# Patient Record
Sex: Male | Born: 1975 | Race: Black or African American | Hispanic: No | Marital: Single | Smoking: Current every day smoker
Health system: Southern US, Community
[De-identification: ages and names within clinical notes are randomized; demographics above are authoritative.]

---

## 1998-10-19 ENCOUNTER — Emergency Department (HOSPITAL_COMMUNITY): Admission: EM | Admit: 1998-10-19 | Discharge: 1998-10-19 | Payer: Self-pay | Admitting: Emergency Medicine

## 1998-10-19 ENCOUNTER — Encounter: Payer: Self-pay | Admitting: Emergency Medicine

## 2013-11-24 ENCOUNTER — Emergency Department (HOSPITAL_COMMUNITY): Payer: Self-pay

## 2013-11-24 ENCOUNTER — Emergency Department (HOSPITAL_COMMUNITY)
Admission: EM | Admit: 2013-11-24 | Discharge: 2013-11-25 | Disposition: A | Discharge status: Home / Self Care | Payer: Self-pay | Attending: Emergency Medicine | Admitting: Emergency Medicine

## 2013-11-24 ENCOUNTER — Encounter (HOSPITAL_COMMUNITY): Payer: Self-pay | Admitting: Emergency Medicine

## 2013-11-24 DIAGNOSIS — R1013 Epigastric pain: Secondary | ICD-10-CM | POA: Insufficient documentation

## 2013-11-24 DIAGNOSIS — F17200 Nicotine dependence, unspecified, uncomplicated: Secondary | ICD-10-CM | POA: Insufficient documentation

## 2013-11-24 LAB — COMPREHENSIVE METABOLIC PANEL
ALK PHOS: 51 U/L (ref 39–117)
ALT: 10 U/L (ref 0–53)
AST: 16 U/L (ref 0–37)
Albumin: 3.5 g/dL (ref 3.5–5.2)
BUN: 10 mg/dL (ref 6–23)
CO2: 24 mEq/L (ref 19–32)
Calcium: 9.2 mg/dL (ref 8.4–10.5)
Chloride: 103 mEq/L (ref 96–112)
Creatinine, Ser: 0.97 mg/dL (ref 0.50–1.35)
Glucose, Bld: 115 mg/dL — ABNORMAL HIGH (ref 70–99)
Potassium: 3.7 mEq/L (ref 3.7–5.3)
Sodium: 139 mEq/L (ref 137–147)
TOTAL PROTEIN: 6.3 g/dL (ref 6.0–8.3)
Total Bilirubin: 0.5 mg/dL (ref 0.3–1.2)

## 2013-11-24 LAB — CBC
HCT: 40.8 % (ref 39.0–52.0)
Hemoglobin: 13.9 g/dL (ref 13.0–17.0)
MCH: 28.4 pg (ref 26.0–34.0)
MCHC: 34.1 g/dL (ref 30.0–36.0)
MCV: 83.4 fL (ref 78.0–100.0)
Platelets: 212 10*3/uL (ref 150–400)
RBC: 4.89 MIL/uL (ref 4.22–5.81)
RDW: 12.4 % (ref 11.5–15.5)
WBC: 7.8 10*3/uL (ref 4.0–10.5)

## 2013-11-24 LAB — LIPASE, BLOOD: Lipase: 30 U/L (ref 11–59)

## 2013-11-24 LAB — I-STAT TROPONIN, ED: Troponin i, poc: 0.01 ng/mL (ref 0.00–0.08)

## 2013-11-24 MED ORDER — GI COCKTAIL ~~LOC~~
30.0000 mL | Freq: Once | ORAL | Status: AC
  Administered 2013-11-24: 30 mL via ORAL
  Filled 2013-11-24: qty 30

## 2013-11-24 MED ORDER — FAMOTIDINE 20 MG PO TABS: 20.0000 mg | ORAL_TABLET | Freq: Two times a day (BID) | ORAL | Status: AC

## 2013-11-24 NOTE — ED Notes (Signed)
PA at bedside.

## 2013-11-24 NOTE — ED Provider Notes (Signed)
CSN: [id number]     Arrival date & time 11/24/13  2137 History   First MD Initiated Contact with Patient 11/24/13 2222     Chief Complaint  Patient presents with  . Chest Pain     (Consider location/radiation/quality/duration/timing/severity/associated sxs/prior Treatment) HPI Comments: Howard Wolf is a 38 y.o. Male, presenting the Emergency Department with a chief complaint of epigastric and chest discomfort for over a week. He reports increase in discomfort with deep inspiration, increase in movement, oral intake of soda. He reports epigastric discomfort. Denies nausea, vomiting, constipation, or diarrhea.    Patient is a 38 y.o. male presenting with chest pain. The history is provided by the patient. No language interpreter was used.  Chest Pain Associated symptoms: abdominal pain   Associated symptoms: no cough, no fever, no nausea, no palpitations, no shortness of breath and not vomiting     History reviewed. No pertinent past medical history. History reviewed. No pertinent past surgical history. History reviewed. No pertinent family history. History  Substance Use Topics  . Smoking status: Current Every Day Smoker    Types: Cigarettes  . Smokeless tobacco: Not on file  . Alcohol Use: Yes     Comment: Occassional    Review of Systems  Constitutional: Negative for fever and chills.  Respiratory: Negative for cough and shortness of breath.   Cardiovascular: Positive for chest pain. Negative for palpitations and leg swelling.  Gastrointestinal: Positive for abdominal pain. Negative for nausea, vomiting, diarrhea and constipation.      Allergies  Bee venom  Home Medications   Prior to Admission medications   Not on File   BP 132/79  Pulse 63  Resp 15  Ht 5\' 9"  (1.753 m)  Wt 160 lb (72.576 kg)  BMI 23.62 kg/m2  SpO2 99% Physical Exam  Nursing note and vitals reviewed. Constitutional: He is oriented to person, place, and time. He appears well-developed and  well-nourished. No distress.  HENT:  Head: Normocephalic and atraumatic.  Eyes: EOM are normal. Pupils are equal, round, and reactive to light. No scleral icterus.  Neck: Neck supple.  Cardiovascular: Normal rate, regular rhythm and normal heart sounds.   No murmur heard. No lower extremity edema  Pulmonary/Chest: Effort normal and breath sounds normal. He has no wheezes.  Patient is able to speak in complete sentences.   Abdominal: Soft. Bowel sounds are normal. He exhibits no distension. There is tenderness in the epigastric area. There is no rebound and no guarding.  Musculoskeletal: Normal range of motion. He exhibits no edema.  Neurological: He is alert and oriented to person, place, and time.  Skin: Skin is warm and dry. No rash noted.  Psychiatric: He has a normal mood and affect. His behavior is normal.    ED Course  Procedures (including critical care time) Labs Review Labs Reviewed  COMPREHENSIVE METABOLIC PANEL - Abnormal; Notable for the following:    Glucose, Bld 115 (*)    All other components within normal limits  CBC  LIPASE, BLOOD  I-STAT TROPOININ, ED    Imaging Review Dg Chest 2 View  11/24/2013   CLINICAL DATA:  Chest pain, worse with inspiration.  EXAM: CHEST  2 VIEW  COMPARISON:  None.  FINDINGS: Heart size is normal. Mediastinal shadows are normal. The lungs are clear. No pneumothorax or hemothorax. No effusions. No bony abnormalities.  IMPRESSION: Normal chest   Electronically Signed   By: Paulina FusiMark  Shogry M.D.   On: 11/24/2013 23:13     Date:  11/24/2013  Rate: 69  Rhythm: normal sinus rhythm  QRS Axis: indeterminate  Intervals: normal  ST/T Wave abnormalities: early repolarization  Conduction Disutrbances:none  Narrative Interpretation: Borderline Right axis devation  Old EKG Reviewed: none available   MDM   Final diagnoses:  Epigastric abdominal pain   Pt with epigastric and chest discomfort for 1 week, likely reflux.  Labs EKG, XR ordered.   PERC negative. Low risk factors for ACS.  Negative work up and symptoms have have improved with GI cocktail.   Discussed lab results, imaging results, and treatment plan with the patient. Return precautions given. Reports understanding and no other concerns at this time.  Patient is stable for discharge at this time.  Meds given in ED:  Medications  gi cocktail (Maalox,Lidocaine,Donnatal) (30 mLs Oral Given 11/24/13 2238)    Discharge Medication List as of 11/24/2013 11:49 PM    START taking these medications   Details  famotidine (PEPCID) 20 MG tablet Take 1 tablet (20 mg total) by mouth 2 (two) times daily., Starting 11/24/2013, Until Discontinued, Print            Clabe SealLauren M Shalinda Burkholder, PA-C 11/26/13 0202

## 2013-11-24 NOTE — ED Notes (Signed)
Pt states that he has been having chest pain for one week. Pt states that the pain is in the epigastric region, and becomes worse with deep inspiration.

## 2013-11-24 NOTE — Discharge Instructions (Signed)
Call for a follow up appointment with a Family or Primary Care Provider.  °Return if Symptoms worsen.   °Take medication as prescribed.  ° ° °Emergency Department Resource Guide °1) Find a Doctor and Pay Out of Pocket °Although you won't have to find out who is covered by your insurance plan, it is a good idea to ask around and get recommendations. You will then need to call the office and see if the doctor you have chosen will accept you as a new patient and what types of options they offer for patients who are self-pay. Some doctors offer discounts or will set up payment plans for their patients who do not have insurance, but you will need to ask so you aren't surprised when you get to your appointment. ° °2) Contact Your Local Health Department °Not all health departments have doctors that can see patients for sick visits, but many do, so it is worth a call to see if yours does. If you don't know where your local health department is, you can check in your phone book. The CDC also has a tool to help you locate your state's health department, and many state websites also have listings of all of their local health departments. ° °3) Find a Walk-in Clinic °If your illness is not likely to be very severe or complicated, you may want to try a walk in clinic. These are popping up all over the country in pharmacies, drugstores, and shopping centers. They're usually staffed by nurse practitioners or physician assistants that have been trained to treat common illnesses and complaints. They're usually fairly quick and inexpensive. However, if you have serious medical issues or chronic medical problems, these are probably not your best option. ° °No Primary Care Doctor: °- Call [facility] Connect at  832-8000 - they can help you locate a primary care doctor that  accepts your insurance, provides certain services, etc. °- Physician Referral Service- 1-800-533-3463 ° °Chronic Pain Problems: °Organization         Address  Phone    Notes  °[facility]  (336) 297-2271 Patients need to be referred by their primary care doctor.  ° °Medication Assistance: °Organization         Address  Phone   Notes  °[place] [facility] [address] °[place], [place] [address] (336) 641-8030 --Must be a resident of [place] °-- Must have NO insurance coverage whatsoever (no Medicaid/ Medicare, etc.) °-- The pt. MUST have a primary care doctor that directs their care regularly and follows them in the community °  °[facility]  (866) 331-1348   °[employer]  (888) 892-1162   ° °Agencies that provide inexpensive medical care: °Organization         Address  Phone   Notes  °[facility]  (336) 832-8035   °[facility]    (336) 832-7272   °[facility] [address] °[place], [place] [address] (336) 832-4777   °[facility] [address], °[place] (336) 271-4999   °[facility]    (336) 373-0678   °[facility]    (336) 272-1050   °[facility] ° [address], [place] Phone:  (336) 832-4444, Fax:  (336) 832-4440 Hours of Operation:  9 am - 6 pm, M-F.  Also accepts Medicaid/Medicare and self-pay.  °[facility] ° [address], [place]   Phone: (336) 832-3150, Fax: (336) 832-3151. Hours of Operation:  8:30 am - 5:30 pm, M-F.  Also accepts Medicaid and self-pay.  °[facility] [place] [address], [place] Phone: (336) 878-6027   °[facility] [address], [place], [place] (336)723-1848, Ext. 123 Mondays & Thursdays: 7-9 AM.  First 15 patients are seen on a first come, first serve basis. °  ° °Medicaid-accepting [place] Providers: ° °Organization         Address  Phone   Notes  °[facility] [address], [place] (336) 641-2100 Also accepts self-pay patients.  °[facility]  [address], [place] ° (336) 856-9996   °[facility] [address] [place] [address], [place] (336) 288-8857   °[facility] [address] [place] [address], [place] (336) 299-7000   °Veita Bland [address], [place]  ° (336) 373-1557 Only accepts [place] Access Medicaid patients after they have their name applied to their card.  ° °Self-Pay (no insurance) in [place]: ° °Organization         Address  Phone   Notes  °Sickle Cell Patients, [facility] [address], [place] (336) 832-1970   °[facility] [address], [place] (336) 832-4400   ° [facility] ° [address], [place] (336) 992-4800   °[facility]/Dr. Osei-Bonsu ° [address] [place] [address], [place] or [address], [place] (336) 841-8500 Phone number for both [place] and [place] locations is the same.  °[facility] [address], [place] (336) 299-0000   °[facility] [address] [place] [address], [place] or [address] (336) 852-7530 °(336) 878-2260   °[facility] [address], [place] (336) 350-1642, phone; (336) 294-5005, fax Sees patients 1st and 3rd Saturday of every month.  Must not qualify for public or private insurance (i.e. Medicaid, Medicare, [place] Health Choice, Veterans' Benefits) • Household income should be no more than 200% of the poverty level •The clinic cannot treat you if you are pregnant or think you are pregnant • Sexually transmitted diseases are not treated at the clinic.  ° ° °Dental Care: °Organization         Address  Phone  Notes  °[facility] [address], [place] (336) 641-6152 Accepts children up to age 21 who are enrolled in Medicaid or [place] Health Choice; pregnant women with a Medicaid card; and children who have  applied for Medicaid or [place] Health Choice, but were declined, whose parents can pay a reduced fee at time of service.  °[facility] [place]  [address], [place] (336) 641-7733 Accepts children up to age 21 who are enrolled in Medicaid or [place] Health Choice; pregnant women with a Medicaid card; and children who have applied for Medicaid or [place] Health Choice, but were declined, whose parents can pay a reduced fee at time of service.  °[facility] ° [address],  (336) 641-4533 Patients are seen by appointment only. Walk-ins are not accepted. [facility] will see patients 18 years of age and older. °Monday - Tuesday (8am-5pm) °Most Wednesdays (8:30-5pm) °$30 per visit, cash only  °[facility] ° [address], [place] (336) 641-4533 Patients are seen by appointment only. Walk-ins are not accepted. [facility] will see patients 18 years of age and older. °One Wednesday Evening (Monthly: Volunteer Based).  $30 per visit, cash only  °[facility]  (919) 537-3737 for adults; Children under age 4, call Graduate Pediatric Dentistry at (919) 537-3956. Children aged 4-14, please call (919) 537-3737 to request a pediatric application. ° Dental services are provided in all areas of dental care including fillings, crowns and bridges, complete and partial dentures, implants, gum treatment, root canals, and extractions. Preventive care is also provided. Treatment is provided to both adults and children. °Patients are selected via a lottery and there is often a waiting list. °  °[facility] [address], °[place] ° (336) 763-8833 [email or web address] °  °[facility] [address], [place], [place] (336)723-1848, Ext. 123 Second and Fourth Thursday of each month, opens at 6:30 AM; Clinic ends at 9 AM.  Patients are seen on a first-come first-served basis, and a  limited number are seen during each clinic.  ° °[facility] ° [address], [place], [place] (336) 723-7904   Eligibility Requirements °You must have lived in [place], [place], or [place] counties for at least the last three months. °  You cannot be eligible for state or federal sponsored healthcare insurance, including Veterans Administration, Medicaid, or Medicare. °  You generally cannot be eligible for healthcare insurance through your employer.  °  How to apply: °Eligibility screenings are held every Tuesday and Wednesday afternoon from 1:00 pm until 4:00 pm. You do not need an appointment for the interview!  °[facility] [address], [place], [place] 336-631-2330   °[facility]  336-342-8273   °[facility]  336-703-3100   °[facility]  336-570-6415   ° °Behavioral Health Resources in the Community: °Intensive Outpatient Programs °Organization         Address  Phone  Notes  °[facility] [address], [place], [place] 336-878-6098   °[facility] [address], [place], [place] 336-832-9800   °[facility] [address], [place], [place] ° 336-882-2125   °[facility] [address],  °[place], [place] 1-800-853-5163 or 336-641-4981   °Substance Abuse Resources °Organization         Address  Phone  Notes  °[facility]  336-882-2125   °[facility]  336-784-9470   °[facility]  336-285-9073   °[facility]  336-845-3988   °Residential & Outpatient Substance Abuse Program  1-800-659-3381   °Psychological Services °Organization         Address  Phone  Notes  ° Health  336- 832-9600   °[facility]  336- 378-7881   °[facility] [address], [place] 1-800-853-5163 or 336-641-4981   ° °Mobile Crisis Teams °Organization          Address  Phone  Notes  °[facility], Mobile Crisis Care Unit  1-877-626-1772   °Assertive °[facility] ° [address] [place], [place] 336-834-9664   °Sharon DeEsch [address] °[place] 336-554-5454   ° °Self-Help/Support Groups °Organization         Address  Phone             Notes  °[facility] [place] - variety of   support groups  336- 373-1402 Call for more information  °Narcotics Anonymous (NA), [facility] [address], °[place]  2 meetings at this location  ° °Residential Treatment Programs °Organization         Address  Phone  Notes  °[facility] [address],    °[place]  1-866-801-8205   °[facility] ° [address], [place], [place] 704-293-8524   °[facility] [address], [place] 336-845-3988 Admissions: 8am-3pm M-F  °[facility] [address]    °[place], [place] 336-841-1104   °[facility] [address], [place], [place] 336-379-7146   °[facility] [address]  °[place], [place] 336-285-9073   °[facility] Intensive Outpatient [address], [place], [place] 336-852-3033   °[facility] [address]  °[place], [place] 1-877-615-2722 or 336-784-9470   °[facility] ([facility] [address] [place], [place] 336-227-7417 Accepts Medicaid  °[facility] [address]  °[place] 1-800-659-3381 Substance Abuse/Addiction Treatment  ° °[place] Behavioral Health Resources °Organization         Address  Phone  Notes  °[facility]  (888) 581-9988   °Julie Brannon, PhD [address] [place], [place]   (336) 349-5553 or (336) 951-0000   °[facility]   [address] °[place], [place] (336) 349-4454   °[facility] [address], [place], [place] (336) 342-8316 Insurance/Medicaid/sponsorship  through [facility]  °[facility] [address]                                    [place], [place] (336) 342-8316 Therapy/tele-psych/case  °[facility] [address]  ° [place], [place] (336) 349-2233    °Dr. Arfeen  (336) 349-4544   °[facility] [place]  [employer] [place] [facility] 1) [address], [place] °2) [address], [place] °3)  [address], [place] (336) 349-3220 °(336) 342-7768 ° °(336) 342-8140   °[place] [facility] (336) 342-1394 or (336) 342-3537 (After Hours)    ° °

## 2013-11-26 NOTE — ED Provider Notes (Signed)
Medical screening examination/treatment/procedure(s) were performed by non-physician practitioner and as supervising physician I was immediately available for consultation/collaboration.   EKG Interpretation   Date/Time:  Wednesday November 24 2013 21:42:56 EDT Ventricular Rate:  69 PR Interval:  170 QRS Duration: 88 QT Interval:  390 QTC Calculation: 418 R Axis:   92 Text Interpretation:  Age not entered, assumed to be  38 years old for  purpose of ECG interpretation Sinus rhythm Borderline right axis deviation  ST elev, probable normal early repol pattern ED PHYSICIAN INTERPRETATION  AVAILABLE IN [facility] Confirmed by TEST, Record ([id number]) on  11/26/2013 7:06:44 AM        Gwyneth SproutWhitney Shasta Chinn, MD 11/26/13 [id number]

## 2015-01-16 IMAGING — CR DG CHEST 2V
2 series · 2 of 2 positions shown · non-contrast
Comparison: None.

CLINICAL DATA: Chest pain, worse with inspiration.

EXAM:
CHEST  2 VIEW

[w chest pa]
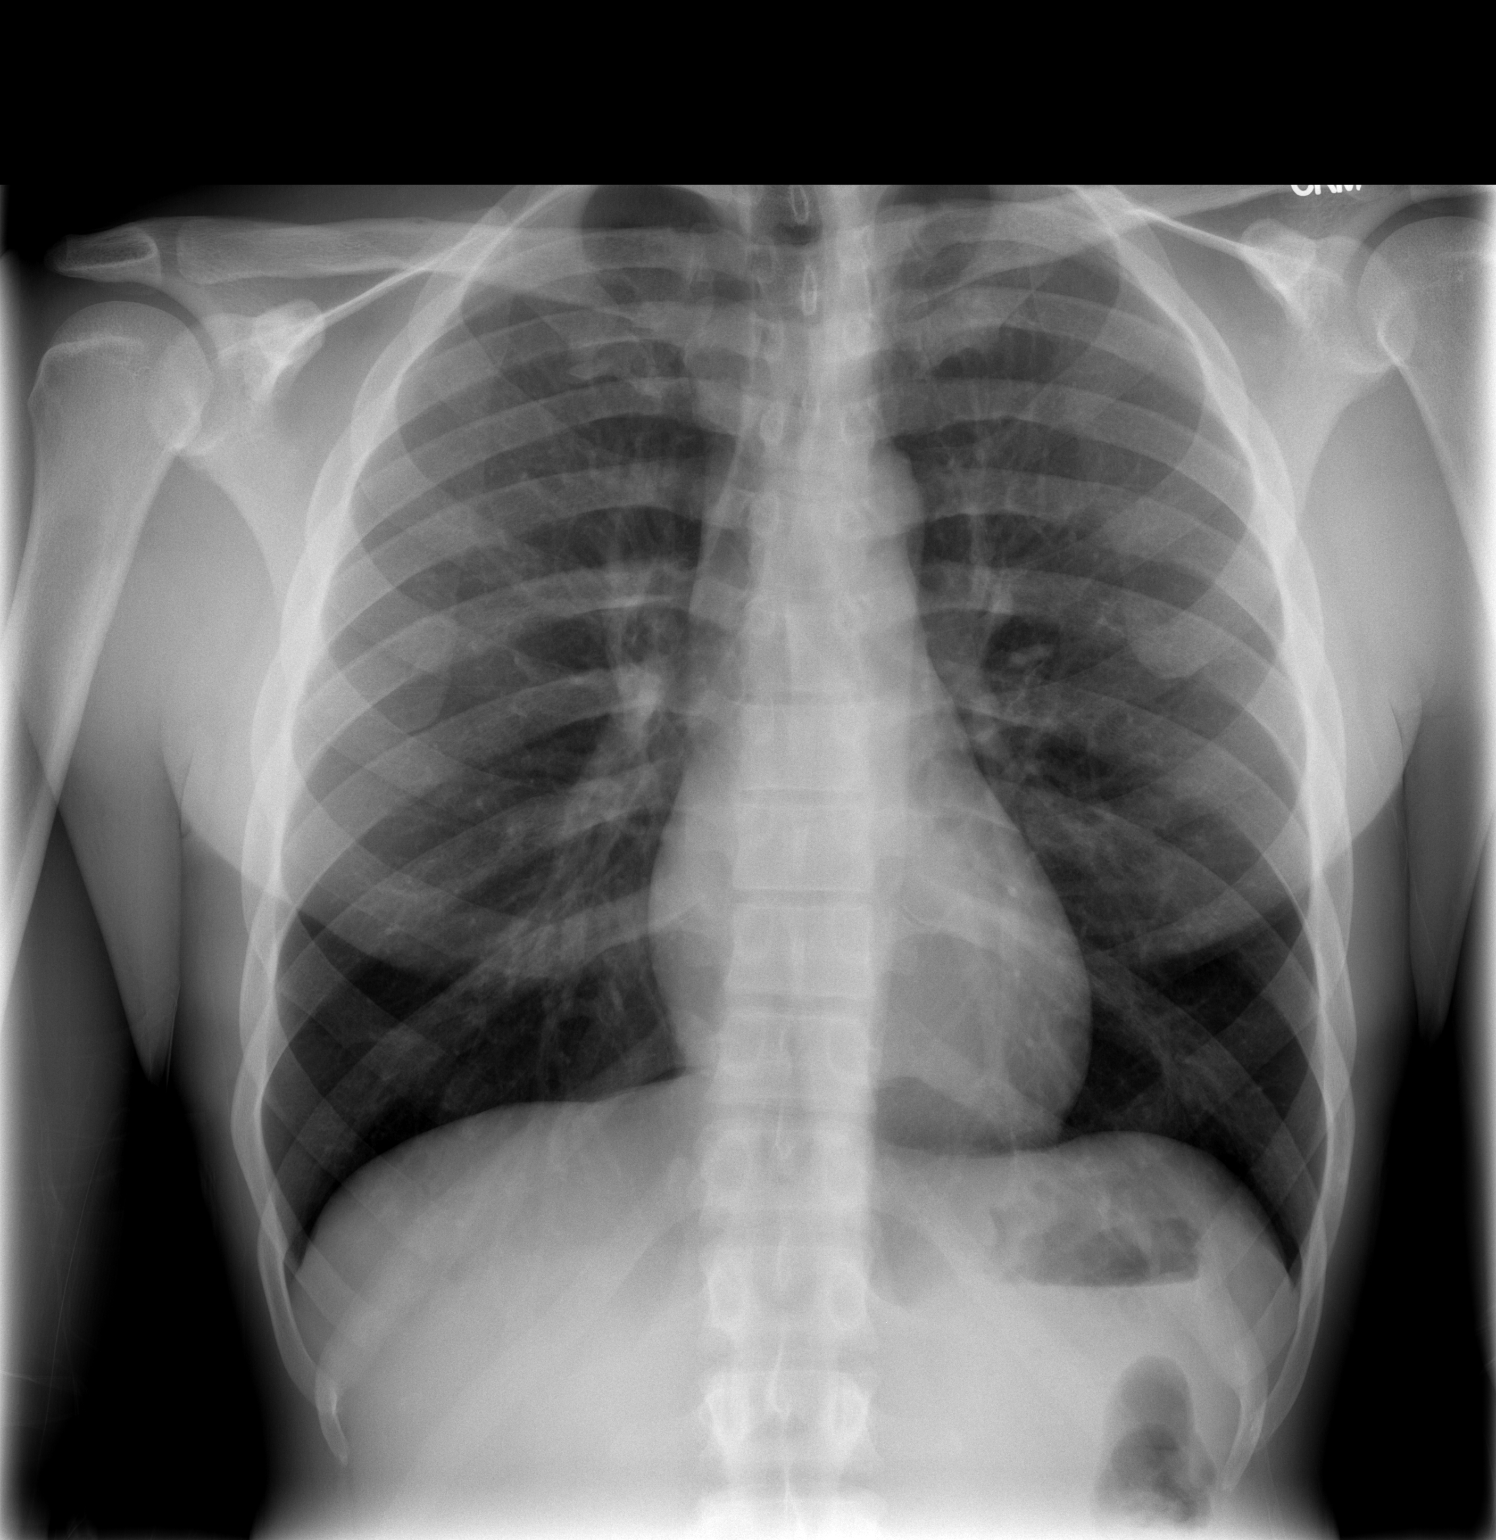

[w chest lat]
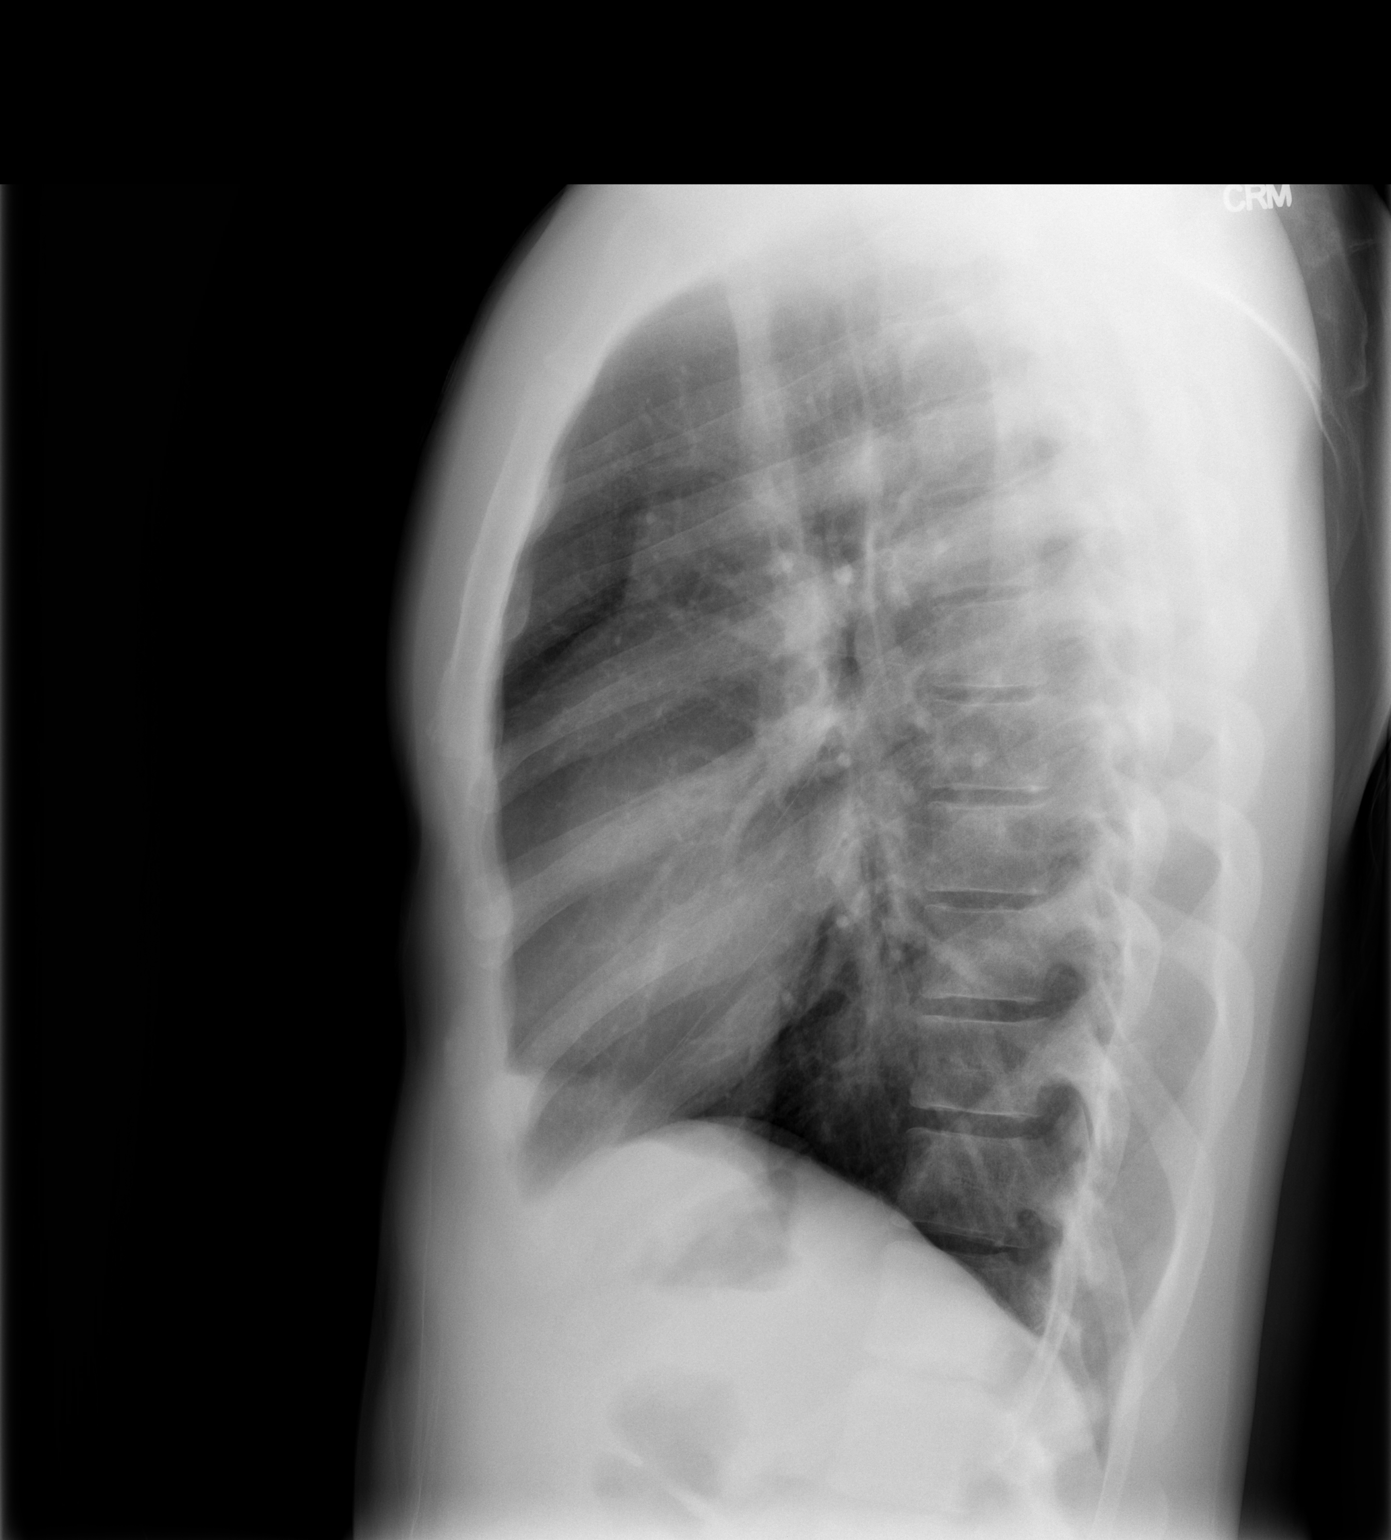

[2 of 2 positions shown; findings below may reference images not displayed]

FINDINGS: Heart size is normal. Mediastinal shadows are normal. The lungs are
clear. No pneumothorax or hemothorax. No effusions. No bony
abnormalities.
IMPRESSION: Normal chest

## 2017-06-16 ENCOUNTER — Encounter: Payer: Self-pay | Admitting: Podiatry

## 2017-06-16 ENCOUNTER — Ambulatory Visit: Payer: BLUE CROSS/BLUE SHIELD | Admitting: Podiatry

## 2017-06-16 VITALS — BP 127/80 | HR 93 | Ht 69.0 in | Wt 160.0 lb

## 2017-06-16 DIAGNOSIS — B070 Plantar wart: Secondary | ICD-10-CM

## 2017-06-16 NOTE — Progress Notes (Signed)
   Subjective:    Patient ID: Howard Wolf, male    DOB: 1976-05-10, 41 y.o.   MRN: [id number]  HPI  Chief Complaint  Patient presents with  . Plantar Warts    left foot/ wart vs callus       Review of Systems  All other systems reviewed and are negative.      Objective:   Physical Exam        Assessment & Plan:

## 2017-06-19 NOTE — Progress Notes (Signed)
   Subjective: Patient presents today with pain and tenderness on the plantar aspect of the left great toe secondary to a plantars wart. Patient states that the pain has been present for several weeks now. Patient denies trauma.  History reviewed. No pertinent past medical history.  Objective: Physical Exam General: The patient is alert and oriented x3 in no acute distress.  Dermatology: Hyperkeratotic skin lesion noted to the plantar aspect of the left foot approximately 1 cm in diameter. Pinpoint bleeding noted upon debridement. Skin is warm, dry and supple bilateral lower extremities. Negative for open lesions or macerations.  Vascular: Palpable pedal pulses bilaterally. No edema or erythema noted. Capillary refill within normal limits.  Neurological: Epicritic and protective threshold grossly intact bilaterally.   Musculoskeletal Exam: Pain on palpation to the note skin lesion.  Range of motion within normal limits to all pedal and ankle joints bilateral. Muscle strength 5/5 in all groups bilateral.   Assessment: #1 plantar wart left foot #2 pain in left foot   Plan of Care:  #1 Patient was evaluated. #2 Cantharone was applied and the lesion was dressed with a dry sterile dressing. #3 patient is to return to clinic in 2 weeks  Felecia ShellingBrent M. Genae Strine, DPM [facility]  Dr. Felecia ShellingBrent M. Veralyn Lopp, DPM    [address]                                        [place], [place] [address]                Office [phone]  Fax [phone]

## 2017-06-30 ENCOUNTER — Encounter: Payer: Self-pay | Admitting: Podiatry

## 2017-06-30 ENCOUNTER — Ambulatory Visit (INDEPENDENT_AMBULATORY_CARE_PROVIDER_SITE_OTHER): Payer: BLUE CROSS/BLUE SHIELD | Admitting: Podiatry

## 2017-06-30 DIAGNOSIS — B070 Plantar wart: Secondary | ICD-10-CM | POA: Diagnosis not present

## 2017-07-01 ENCOUNTER — Encounter: Payer: Self-pay | Admitting: Sports Medicine

## 2017-07-01 ENCOUNTER — Ambulatory Visit (INDEPENDENT_AMBULATORY_CARE_PROVIDER_SITE_OTHER): Payer: BLUE CROSS/BLUE SHIELD | Admitting: Sports Medicine

## 2017-07-01 DIAGNOSIS — B070 Plantar wart: Secondary | ICD-10-CM

## 2017-07-01 DIAGNOSIS — M79672 Pain in left foot: Secondary | ICD-10-CM

## 2017-07-01 NOTE — Progress Notes (Signed)
Subjective: Howard Wolf is a 41 y.o. male patient who presents to office for evaluation of Left foot pain. Patient complains of progressive pain after wart was treated on yesterday by Dr. Logan BoresEvans. Patient states went back to work and as the day went on started to have more pain to ball of foot and had to use crutches. Patient denies any other pedal complaints.    There are no active problems to display for this patient.   Current Outpatient Medications on File Prior to Visit  Medication Sig Dispense Refill  . famotidine (PEPCID) 20 MG tablet Take 1 tablet (20 mg total) by mouth 2 (two) times daily. 60 tablet 0   No current facility-administered medications on file prior to visit.     Allergies  Allergen Reactions  . Bee Venom Anaphylaxis    Objective:  General: Alert and oriented x3 in no acute distress  Dermatology: Macerated blister at ball of forefoot on left at area of previous wart treatment with no warmth or acute signs of infection, no webspace macerations, no ecchymosis bilateral, all nails x 10 are well manicured.  Vascular: Dorsalis Pedis and Posterior Tibial pedal pulses palpable, Capillary Fill Time 3 seconds,(+) pedal hair growth bilateral, no edema bilateral lower extremities, Temperature gradient within normal limits.  Neurology: Michaell CowingGross sensation intact via light touch bilateral..   Musculoskeletal: Moderate tenderness with palpation at ball of left foot at area of macerated blister. No other acute pedal deformities.    Assessment and Plan: Problem List Items Addressed This Visit    None    Visit Diagnoses    Plantar wart of left foot    -  Primary   Left foot pain          -Complete examination performed -Discussed treatement options for blister reaction secondary to Canthrone at left foot  -After sterile prep lanced blister at left plantar forefoot using sterile chisel blade and applied antibiotic cream and offloading padding covered with sterile  dressing -Recommend epsom soaks x 1 week and to redress as above -Recommend patient to use post op shoe and continue with crutches as needed until pain is better  -Work note sitting job until next visit  -Patient to return to office in 2 weeks for follow up with Dr. Logan BoresEvans or sooner if condition worsens.  Asencion Islamitorya Jacere Pangborn, DPM

## 2017-07-01 NOTE — Patient Instructions (Signed)
Soak with warm water and 1/4 cup of Epsom salt x 1 week Apply antibiotic cream and offloading padding Use post op shoe and crutches to help keep pressure off until it feels better Return in 2 weeks for follow up with Dr. Logan BoresEvans

## 2017-07-05 NOTE — Progress Notes (Signed)
   Subjective: Patient presents today for follow up evaluation of a plantar wart to the left foot. He reports some improvement of his symptoms. He is requesting a work noted the allows him not to be on his feet as much. Patient is here for further evaluation and treatment.    No past medical history on file.  Objective: Physical Exam General: The patient is alert and oriented x3 in no acute distress.  Dermatology: Hyperkeratotic skin lesion noted to the plantar aspect of the left foot approximately 1 cm in diameter. Pinpoint bleeding noted upon debridement. Skin is warm, dry and supple bilateral lower extremities. Negative for open lesions or macerations.  Vascular: Palpable pedal pulses bilaterally. No edema or erythema noted. Capillary refill within normal limits.  Neurological: Epicritic and protective threshold grossly intact bilaterally.   Musculoskeletal Exam: Pain on palpation to the note skin lesion.  Range of motion within normal limits to all pedal and ankle joints bilateral. Muscle strength 5/5 in all groups bilateral.   Assessment: #1 plantar wart left foot #2 pain in left foot   Plan of Care:  #1 Patient was evaluated. #2 Cantharone was applied and the lesion was dressed with a dry sterile dressing. #3 Note for work provided. #4 patient is to return to clinic in 2 weeks  Felecia ShellingBrent M. Brittanya Wolf, DPM [facility]  Dr. Felecia ShellingBrent M. Lakeishia Wolf, DPM    [address]                                        [place], [place] [address]                Office [phone]([phone]  Fax [phone](336) [phone]

## 2017-07-14 ENCOUNTER — Ambulatory Visit (INDEPENDENT_AMBULATORY_CARE_PROVIDER_SITE_OTHER): Payer: BLUE CROSS/BLUE SHIELD | Admitting: Podiatry

## 2017-07-14 DIAGNOSIS — B070 Plantar wart: Secondary | ICD-10-CM

## 2017-07-15 ENCOUNTER — Other Ambulatory Visit: Payer: Self-pay

## 2017-07-15 ENCOUNTER — Telehealth: Payer: Self-pay | Admitting: Podiatry

## 2017-07-15 MED ORDER — NONFORMULARY OR COMPOUNDED ITEM: 1.0000 g | Freq: Three times a day (TID) | 2 refills | Status: AC

## 2017-07-15 NOTE — Telephone Encounter (Signed)
[facility] Logan Bores[facility]ans supposedly sent here. They said they do not have the prescription. I'm just wondering what's going on. Please call me back at [phone].

## 2017-07-16 NOTE — Progress Notes (Signed)
   Subjective: Patient presents today for follow up evaluation of a plantar wart to the left foot.  He states the area looks better and the pain has improved.  He has no new complaints at this time.  Improved patient is here for further evaluation and treatment.    No past medical history on file.  Objective: Physical Exam General: The patient is alert and oriented x3 in no acute distress.  Dermatology: Hyperkeratotic skin lesion noted to the plantar aspect of the left foot approximately 1 cm in diameter. Pinpoint bleeding noted upon debridement. Skin is warm, dry and supple bilateral lower extremities. Negative for open lesions or macerations.  Vascular: Palpable pedal pulses bilaterally. No edema or erythema noted. Capillary refill within normal limits.  Neurological: Epicritic and protective threshold grossly intact bilaterally.   Musculoskeletal Exam: Pain on palpation to the note skin lesion.  Range of motion within normal limits to all pedal and ankle joints bilateral. Muscle strength 5/5 in all groups bilateral.   Assessment: #1 plantar wart left foot -improved   Plan of Care:  #1 Patient was evaluated. #2 Excisional debridement of hyperkeratotic lesion using a chisel blade was performed without incident.  #3 Treated area(s) with Salinocaine and dressed with light dressing. #4 Recommended good shoe gear.   #5 return to clinic as needed.    Felecia ShellingBrent M. Ann-Marie Kluge, DPM [facility]  Dr. Felecia ShellingBrent M. Rodric Punch, DPM    [address]                                        [place], [place] [address]                Office [phone]  Fax [phone](336) [phone]

## 2018-01-19 ENCOUNTER — Ambulatory Visit: Payer: BLUE CROSS/BLUE SHIELD | Admitting: Podiatry

## 2018-01-19 DIAGNOSIS — L989 Disorder of the skin and subcutaneous tissue, unspecified: Secondary | ICD-10-CM | POA: Diagnosis not present

## 2018-01-19 NOTE — Patient Instructions (Signed)
Pre-Operative Instructions  Congratulations, you have decided to take an important step towards improving your quality of life.  You can be assured that the doctors and staff at [facility] will be with you every step of the way.  Here are some important things you should know:  1. Plan to be at the surgery center/hospital at least 1 (one) hour prior to your scheduled time, unless otherwise directed by the surgical center/hospital staff.  You must have a responsible adult accompany you, remain during the surgery and drive you home.  Make sure you have directions to the surgical center/hospital to ensure you arrive on time. 2. If you are having surgery at [facility] or  hospitals, you will need a copy of your medical history and physical form from your family physician within one month prior to the date of surgery. We will give you a form for your primary physician to complete.  3. We make every effort to accommodate the date you request for surgery.  However, there are times where surgery dates or times have to be moved.  We will contact you as soon as possible if a change in schedule is required.   4. No aspirin/ibuprofen for one week before surgery.  If you are on aspirin, any non-steroidal anti-inflammatory medications (Mobic, Aleve, Ibuprofen) should not be taken seven (7) days prior to your surgery.  You make take Tylenol for pain prior to surgery.  5. Medications - If you are taking daily heart and blood pressure medications, seizure, reflux, allergy, asthma, anxiety, pain or diabetes medications, make sure you notify the surgery center/hospital before the day of surgery so they can tell you which medications you should take or avoid the day of surgery. 6. No food or drink after midnight the night before surgery unless directed otherwise by surgical center/hospital staff. 7. No alcoholic beverages 24-hours prior to surgery.  No smoking 24-hours prior or 24-hours after  surgery. 8. Wear loose pants or shorts. They should be loose enough to fit over bandages, boots, and casts. 9. Don't wear slip-on shoes. Sneakers are preferred. 10. Bring your boot with you to the surgery center/hospital.  Also bring crutches or a walker if your physician has prescribed it for you.  If you do not have this equipment, it will be provided for you after surgery. 11. If you have not been contacted by the surgery center/hospital by the day before your surgery, call to confirm the date and time of your surgery. 12. Leave-time from work may vary depending on the type of surgery you have.  Appropriate arrangements should be made prior to surgery with your employer. 13. Prescriptions will be provided immediately following surgery by your doctor.  Fill these as soon as possible after surgery and take the medication as directed. Pain medications will not be refilled on weekends and must be approved by the doctor. 14. Remove nail polish on the operative foot and avoid getting pedicures prior to surgery. 15. Wash the night before surgery.  The night before surgery wash the foot and leg well with water and the antibacterial soap provided. Be sure to pay special attention to beneath the toenails and in between the toes.  Wash for at least three (3) minutes. Rinse thoroughly with water and dry well with a towel.  Perform this wash unless told not to do so by your physician.  Enclosed: 1 Ice pack (please put in freezer the night before surgery)   1 Hibiclens skin cleaner     Pre-op instructions  If you have any questions regarding the instructions, please do not hesitate to call our office.  [place]: [address], [place], [place] [address] -- [phone]  [place]: [address] [place], [place] [address] -- [phone]  [place]: [address]  , [place] [address] -- [phone]  [place]: [address], [place], [place] [address] -- [phone]  Website:  [email or web address] 

## 2018-01-26 NOTE — Progress Notes (Signed)
   Subjective: 42 year old male presenting today for follow up evaluation of a painful lesion noted to the left plantar forefoot. He states the pain has remained the same and treatment is not helping. Walking and bearing weight increases the pain. Patient is here for further evaluation and treatment.   No past medical history on file.   Objective:  Physical Exam General: Alert and oriented x3 in no acute distress  Dermatology: Hyperkeratotic lesion present on the left plantar forefoot. Pain on palpation with a central nucleated core noted. Skin is warm, dry and supple bilateral lower extremities. Negative for open lesions or macerations.  Vascular: Palpable pedal pulses bilaterally. No edema or erythema noted. Capillary refill within normal limits.  Neurological: Epicritic and protective threshold grossly intact bilaterally.   Musculoskeletal Exam: Pain on palpation at the keratotic lesion noted. Range of motion within normal limits bilateral. Muscle strength 5/5 in all groups bilateral.  Assessment: 1. Benign skin lesion left plantar forefoot   Plan of Care:  1. Patient evaluated 2. Today we discussed the conservative versus surgical management of the presenting pathology. The patient opts for surgical management. All possible complications and details of the procedure were explained. All patient questions were answered. No guarantees were expressed or implied. 3. Authorization for surgery was initiated today. Surgery will consist of excision biopsy of benign skin lesion of the left foot. 4. Return to clinic one week post op.     Felecia ShellingBrent M. Hadyn Azer, DPM [facility]  Dr. Felecia ShellingBrent M. Oberon Hehir, DPM    [address]                                        [place], [place] [address]                Office [phone](336) [phone]  Fax [phone]([phone]

## 2018-02-03 DIAGNOSIS — M79676 Pain in unspecified toe(s): Secondary | ICD-10-CM

## 2018-02-19 ENCOUNTER — Encounter: Payer: Self-pay | Admitting: Podiatry

## 2018-02-19 DIAGNOSIS — M67472 Ganglion, left ankle and foot: Secondary | ICD-10-CM | POA: Diagnosis not present

## 2018-02-24 ENCOUNTER — Encounter: Payer: Self-pay | Admitting: Podiatry

## 2018-02-25 ENCOUNTER — Other Ambulatory Visit: Payer: BLUE CROSS/BLUE SHIELD

## 2018-02-26 ENCOUNTER — Ambulatory Visit (INDEPENDENT_AMBULATORY_CARE_PROVIDER_SITE_OTHER): Payer: BLUE CROSS/BLUE SHIELD | Admitting: Podiatry

## 2018-02-26 VITALS — Temp 97.6°F

## 2018-02-26 DIAGNOSIS — B070 Plantar wart: Secondary | ICD-10-CM

## 2018-02-26 DIAGNOSIS — L989 Disorder of the skin and subcutaneous tissue, unspecified: Secondary | ICD-10-CM

## 2018-02-26 NOTE — Progress Notes (Signed)
Subjective:   Patient ID: Howard Wolf, male   DOB: 42 y.o.   MRN: [id number]   HPI Patient presents with a well-healed surgical site plantar aspect left from removal of benign lesion   ROS      Objective:  Physical Exam  Neurovascular status intact negative Homans sign noted incision intact with wound edges well coapted     Assessment:  Chronic lesion left that was excised and is doing well     Plan:  Sterile dressing reapplied and instructed on continued immobilization reappoint 1 week suture removal or earlier if needed

## 2018-02-27 ENCOUNTER — Encounter: Payer: Self-pay | Admitting: Podiatry

## 2018-03-04 ENCOUNTER — Encounter: Payer: BLUE CROSS/BLUE SHIELD | Admitting: Podiatry

## 2018-03-08 NOTE — Progress Notes (Signed)
This encounter was created in error - please disregard.

## 2018-03-11 ENCOUNTER — Ambulatory Visit (INDEPENDENT_AMBULATORY_CARE_PROVIDER_SITE_OTHER): Payer: BLUE CROSS/BLUE SHIELD | Admitting: Podiatry

## 2018-03-11 DIAGNOSIS — L989 Disorder of the skin and subcutaneous tissue, unspecified: Secondary | ICD-10-CM

## 2018-03-11 DIAGNOSIS — Z9889 Other specified postprocedural states: Secondary | ICD-10-CM

## 2018-03-17 NOTE — Progress Notes (Signed)
   Subjective:  Patient presents today status post excision of benign lesion of the left foot. DOS: 02/19/18. He states he is doing well overall. He denies any complaints at this time. Patient is here for further evaluation and treatment.    No past medical history on file.    Objective/Physical Exam Neurovascular status intact.  Skin incisions appear to be well coapted with sutures and staples intact. No sign of infectious process noted. No dehiscence. No active bleeding noted. Moderate edema noted to the surgical extremity.  Assessment: 1. s/p excision of benign lesion of the left foot. DOS: 02/19/18   Plan of Care:  1. Patient was evaluated.  2. Sutures removed. Dry sterile dressing applied.  3. Continue using post op shoe. Weightbearing as tolerated.  4. Return to clinic in 2 weeks.    Felecia ShellingBrent M. Jusiah Aguayo, DPM [facility]  Dr. Felecia ShellingBrent M. Philbert Ocallaghan, DPM    [address]                                        [place], [place] [address]                Office [phone](336) [phone]  Fax [phone](336) [phone]

## 2018-03-25 ENCOUNTER — Encounter: Payer: BLUE CROSS/BLUE SHIELD | Admitting: Podiatry

## 2018-04-06 ENCOUNTER — Encounter: Payer: Self-pay | Admitting: Podiatry

## 2018-04-06 ENCOUNTER — Ambulatory Visit (INDEPENDENT_AMBULATORY_CARE_PROVIDER_SITE_OTHER): Payer: BLUE CROSS/BLUE SHIELD | Admitting: Podiatry

## 2018-04-06 DIAGNOSIS — Z9889 Other specified postprocedural states: Secondary | ICD-10-CM

## 2018-04-06 DIAGNOSIS — L989 Disorder of the skin and subcutaneous tissue, unspecified: Secondary | ICD-10-CM

## 2018-04-08 NOTE — Progress Notes (Signed)
   Subjective:  Patient presents today status post excision of benign lesion of the left foot. DOS: 02/19/18. He states he is doing well overall. He denies any complaints at this time. Patient is here for further evaluation and treatment.    No past medical history on file.    Objective/Physical Exam Neurovascular status intact.  Skin incisions appear to be well coapted. No sign of infectious process noted. No dehiscence. No active bleeding noted. Moderate edema noted to the surgical extremity.  Assessment: 1. s/p excision of benign lesion of the left foot. DOS: 02/19/18   Plan of Care:  1. Patient was evaluated.  2. May resume full duty with no restrictions.  3. Note for work provided.  4. Return to clinic as needed.     Felecia ShellingBrent M. Allia Wiltsey, DPM [facility]  Dr. Felecia ShellingBrent M. Thorvald Orsino, DPM    [address]                                        [place], [place] [address]                Office [phone](336) [phone]  Fax [phone](336) [phone]

## 2019-02-10 ENCOUNTER — Ambulatory Visit (INDEPENDENT_AMBULATORY_CARE_PROVIDER_SITE_OTHER): Payer: BC Managed Care – PPO

## 2019-02-10 ENCOUNTER — Encounter: Payer: Self-pay | Admitting: Podiatry

## 2019-02-10 ENCOUNTER — Other Ambulatory Visit: Payer: Self-pay

## 2019-02-10 ENCOUNTER — Ambulatory Visit: Payer: BC Managed Care – PPO | Admitting: Podiatry

## 2019-02-10 VITALS — Temp 98.1°F

## 2019-02-10 DIAGNOSIS — L989 Disorder of the skin and subcutaneous tissue, unspecified: Secondary | ICD-10-CM | POA: Diagnosis not present

## 2019-02-10 DIAGNOSIS — M7742 Metatarsalgia, left foot: Secondary | ICD-10-CM

## 2019-02-10 NOTE — Patient Instructions (Signed)
Pre-Operative Instructions  Congratulations, you have decided to take an important step towards improving your quality of life.  You can be assured that the doctors and staff at [facility] will be with you every step of the way.  Here are some important things you should know:  1. Plan to be at the surgery center/hospital at least 1 (one) hour prior to your scheduled time, unless otherwise directed by the surgical center/hospital staff.  You must have a responsible adult accompany you, remain during the surgery and drive you home.  Make sure you have directions to the surgical center/hospital to ensure you arrive on time. 2. If you are having surgery at [facility] or [facility], you will need a copy of your medical history and physical form from your family physician within one month prior to the date of surgery. We will give you a form for your primary physician to complete.  3. We make every effort to accommodate the date you request for surgery.  However, there are times where surgery dates or times have to be moved.  We will contact you as soon as possible if a change in schedule is required.   4. No aspirin/ibuprofen for one week before surgery.  If you are on aspirin, any non-steroidal anti-inflammatory medications (Mobic, Aleve, Ibuprofen) should not be taken seven (7) days prior to your surgery.  You make take Tylenol for pain prior to surgery.  5. Medications - If you are taking daily heart and blood pressure medications, seizure, reflux, allergy, asthma, anxiety, pain or diabetes medications, make sure you notify the surgery center/hospital before the day of surgery so they can tell you which medications you should take or avoid the day of surgery. 6. No food or drink after midnight the night before surgery unless directed otherwise by surgical center/hospital staff. 7. No alcoholic beverages 24-hours prior to surgery.  No smoking 24-hours prior or 24-hours after  surgery. 8. Wear loose pants or shorts. They should be loose enough to fit over bandages, boots, and casts. 9. Don't wear slip-on shoes. Sneakers are preferred. 10. Bring your boot with you to the surgery center/hospital.  Also bring crutches or a walker if your physician has prescribed it for you.  If you do not have this equipment, it will be provided for you after surgery. 11. If you have not been contacted by the surgery center/hospital by the day before your surgery, call to confirm the date and time of your surgery. 12. Leave-time from work may vary depending on the type of surgery you have.  Appropriate arrangements should be made prior to surgery with your employer. 13. Prescriptions will be provided immediately following surgery by your doctor.  Fill these as soon as possible after surgery and take the medication as directed. Pain medications will not be refilled on weekends and must be approved by the doctor. 14. Remove nail polish on the operative foot and avoid getting pedicures prior to surgery. 15. Wash the night before surgery.  The night before surgery wash the foot and leg well with water and the antibacterial soap provided. Be sure to pay special attention to beneath the toenails and in between the toes.  Wash for at least three (3) minutes. Rinse thoroughly with water and dry well with a towel.  Perform this wash unless told not to do so by your physician.  Enclosed: 1 Ice pack (please put in freezer the night before surgery)   1 Hibiclens skin cleaner     Pre-op instructions  If you have any questions regarding the instructions, please do not hesitate to call our office.  [place]: [address], [place], [place] [address] -- [phone]  [place]: [address] [place], [place] [address] -- [phone]  [place]: [address]  [place], [place] [address] -- [phone]  [place]: [address], [place], [place] [address] -- [phone]  Website:  [email or web address] 

## 2019-02-11 ENCOUNTER — Telehealth: Payer: Self-pay | Admitting: *Deleted

## 2019-02-11 NOTE — Telephone Encounter (Signed)
I am calling to inform you that Dr. Amalia Hailey cannot do your surgery on August 6.  He can do it on March 04, 2019 or April 01, 2019.  "The sooner the better, let's do it on July 23."  Thank you so much.  I'll get it scheduled.

## 2019-02-17 NOTE — Progress Notes (Signed)
   Subjective: 43 y.o. male presenting to the office today for follow up evaluation of a benign skin lesion noted to the left foot which he had excised on 02/19/2018. He states the lesion has reappeared and is slightly harder and darker now that prior to removal. There are no modifying factors noted and he has not had recent treatment. Patient is here for further evaluation and treatment.   History reviewed. No pertinent past medical history.   Objective:  Physical Exam General: Alert and oriented x3 in no acute distress  Dermatology: Hyperkeratotic lesion(s) present on the left sub-third metatarsal head. Pain on palpation with a central nucleated core noted. Skin is warm, dry and supple bilateral lower extremities. Negative for open lesions or macerations.  Vascular: Palpable pedal pulses bilaterally. No edema or erythema noted. Capillary refill within normal limits.  Neurological: Epicritic and protective threshold grossly intact bilaterally.   Musculoskeletal Exam: Pain on palpation at the keratotic lesion(s) noted. Range of motion within normal limits bilateral. Muscle strength 5/5 in all groups bilateral.  Radiographic Exam:  Normal osseous mineralization. Joint spaces preserved. No fracture/dislocation/boney destruction. Skin marker noted directly overlying third metatarsal head of the left foot.    Assessment: 1. Porokeratosis left foot sub-third MPJ 2. S/p excision of benign skin lesion of the left foot. DOS: 02/19/2018.    Plan of Care:  1. Patient evaluated. X-Rays reviewed.  2. Today we discussed the conservative versus surgical management of the presenting pathology. The patient opts for surgical management. All possible complications and details of the procedure were explained. All patient questions were answered. No guarantees were expressed or implied. 3. Authorization for surgery was initiated today. Surgery will consist of weil shortening osteotomy 3rd metatarsal left.   4. Note provided for work.  5. Return to clinic one week post op.    Edrick Kins, DPM [facility]  Dr. Edrick Kins, [place]                                        [place], [place] [address]                Office [phone]  Fax [phone]

## 2019-02-26 ENCOUNTER — Telehealth: Payer: Self-pay | Admitting: *Deleted

## 2019-02-26 NOTE — Telephone Encounter (Addendum)
DOS 03/04/2019; [address]26 - METATARSAL OSTEOTOMY 3RD LT FOOT  [employer]: Effective Date - 10/20/2016 - 37/85/8850   Not Applicable Individual  [employer]ins[employer] Authorization Required Deductible  Not Applicable  Not Applicable  No $277.41

## 2019-03-04 ENCOUNTER — Other Ambulatory Visit: Payer: Self-pay | Admitting: Podiatry

## 2019-03-04 DIAGNOSIS — M7742 Metatarsalgia, left foot: Secondary | ICD-10-CM | POA: Diagnosis not present

## 2019-03-04 MED ORDER — OXYCODONE-ACETAMINOPHEN 5-325 MG PO TABS: 1.0000 | ORAL_TABLET | Freq: Four times a day (QID) | ORAL | 0 refills | Status: DC | PRN

## 2019-03-04 NOTE — Progress Notes (Signed)
.  postop

## 2019-03-05 ENCOUNTER — Other Ambulatory Visit: Payer: Self-pay | Admitting: Podiatry

## 2019-03-05 ENCOUNTER — Telehealth: Payer: Self-pay | Admitting: Podiatry

## 2019-03-05 MED ORDER — OXYCODONE-ACETAMINOPHEN 5-325 MG PO TABS: 1.0000 | ORAL_TABLET | Freq: Four times a day (QID) | ORAL | 0 refills | Status: DC | PRN

## 2019-03-05 MED ORDER — OXYCODONE-ACETAMINOPHEN 5-325 MG PO TABS: 1.0000 | ORAL_TABLET | Freq: Four times a day (QID) | ORAL | 0 refills | Status: AC | PRN

## 2019-03-05 NOTE — Telephone Encounter (Signed)
Per Dr. Rebekah Chesterfield verbal, he has sent medication into [facility] [address]

## 2019-03-05 NOTE — Progress Notes (Signed)
.  postop

## 2019-03-05 NOTE — Telephone Encounter (Signed)
Pt had surgery yesterday 03/04/19 and had Oxycodone sent to the [facility] at [facility] and he states he is unable to get the prescription from that pharmacy. Pt would like to have the prescription sent to [facility] on [address] instead.

## 2019-03-05 NOTE — Telephone Encounter (Signed)
Rx sent to [facility] on [address] - Dr. Amalia Hailey

## 2019-03-08 ENCOUNTER — Telehealth: Payer: Self-pay

## 2019-03-08 ENCOUNTER — Encounter: Payer: Self-pay | Admitting: Podiatry

## 2019-03-08 NOTE — Telephone Encounter (Signed)
Called pt post surgery; pt stated, "doing alright; no excessive pain or bleeding; nothing unusual; have appt on Wednesday"; informed patient the importance of elevating surgical foot and staying off of it as much as possible; instructed patient to call if any questions or concerns before next post op visit.

## 2019-03-10 ENCOUNTER — Other Ambulatory Visit: Payer: Self-pay

## 2019-03-10 ENCOUNTER — Ambulatory Visit (INDEPENDENT_AMBULATORY_CARE_PROVIDER_SITE_OTHER): Payer: BC Managed Care – PPO

## 2019-03-10 ENCOUNTER — Encounter: Payer: Self-pay | Admitting: Podiatry

## 2019-03-10 ENCOUNTER — Ambulatory Visit (INDEPENDENT_AMBULATORY_CARE_PROVIDER_SITE_OTHER): Payer: BC Managed Care – PPO | Admitting: Podiatry

## 2019-03-10 VITALS — Temp 97.8°F

## 2019-03-10 DIAGNOSIS — Z9889 Other specified postprocedural states: Secondary | ICD-10-CM

## 2019-03-10 DIAGNOSIS — M7742 Metatarsalgia, left foot: Secondary | ICD-10-CM | POA: Diagnosis not present

## 2019-03-11 NOTE — Progress Notes (Signed)
Subjective:   Patient ID: Howard Wolf, male   DOB: 43 y.o.   MRN: [id number]   HPI Patient states seems to be doing pretty good with mild discomfort and taking pain medicines as needed after surgery by Dr. Amalia Hailey 1 week ago   ROS      Objective:  Physical Exam  Neurovascular status intact negative Homans sign noted with patient's left foot healing well wound edges well coapted and incision site not draining or showing any pathology with mild edema noted     Assessment:  Doing well post osteotomy third metatarsal left     Plan:  H&P x-ray reviewed and reapplied sterile dressing with continued immobilization elevation compression and reappoint 1 week or earlier if needed  X-ray indicates screws in place third metatarsal left with no signs of pathology

## 2019-03-17 ENCOUNTER — Other Ambulatory Visit: Payer: Self-pay

## 2019-03-17 ENCOUNTER — Ambulatory Visit (INDEPENDENT_AMBULATORY_CARE_PROVIDER_SITE_OTHER): Payer: Self-pay | Admitting: Podiatry

## 2019-03-17 DIAGNOSIS — M7742 Metatarsalgia, left foot: Secondary | ICD-10-CM

## 2019-03-17 DIAGNOSIS — Z9889 Other specified postprocedural states: Secondary | ICD-10-CM

## 2019-03-20 NOTE — Progress Notes (Signed)
   Subjective:  Patient presents today status post Weil osteotomy 3rd metatarsal left. DOS: 03/04/2019. He states he is doing well. He denies any significant pain or modifying factors. He has been using the CAM boot as directed. Patient is here for further evaluation and treatment.   No past medical history on file.    Objective/Physical Exam Neurovascular status intact.  Skin incisions appear to be well coapted with sutures and staples intact. No sign of infectious process noted. No dehiscence. No active bleeding noted. Moderate edema noted to the surgical extremity.  Assessment: 1. s/p Weil osteotomy 3rd metatarsal left. DOS: 03/04/2019   Plan of Care:  1. Patient was evaluated.  2. Sutures removed.  3. Continue weightbearing in CAM boot for two weeks.  4. Return to clinic in 2 weeks for follow up X-Rays.    Edrick Kins, DPM [facility]  Dr. Edrick Kins, [address]                                        [place], [place] [address]                Office [phone]  Fax [phone]

## 2019-04-05 ENCOUNTER — Ambulatory Visit (INDEPENDENT_AMBULATORY_CARE_PROVIDER_SITE_OTHER): Payer: BC Managed Care – PPO | Admitting: Podiatry

## 2019-04-05 ENCOUNTER — Encounter: Payer: Self-pay | Admitting: Podiatry

## 2019-04-05 ENCOUNTER — Ambulatory Visit (INDEPENDENT_AMBULATORY_CARE_PROVIDER_SITE_OTHER): Payer: BC Managed Care – PPO

## 2019-04-05 ENCOUNTER — Other Ambulatory Visit: Payer: Self-pay

## 2019-04-05 VITALS — Temp 98.5°F

## 2019-04-05 DIAGNOSIS — M7742 Metatarsalgia, left foot: Secondary | ICD-10-CM

## 2019-04-05 DIAGNOSIS — Z9889 Other specified postprocedural states: Secondary | ICD-10-CM

## 2019-04-07 NOTE — Progress Notes (Signed)
   Subjective:  Patient presents today status post Weil osteotomy 3rd metatarsal left. DOS: 03/04/2019. He states he is doing very well. He denies any pain or modifying factors. He has been using the CAM boot as directed. Patient is here for further evaluation and treatment.   History reviewed. No pertinent past medical history.    Objective/Physical Exam Neurovascular status intact.  Skin incisions appear to be well coapted. No sign of infectious process noted. No dehiscence. No active bleeding noted. Moderate edema noted to the surgical extremity.  Radiographic Exam:  Orthopedic hardware and osteotomies sites appear to be stable with routine healing.  Assessment: 1. s/p Weil osteotomy 3rd metatarsal left. DOS: 03/04/2019   Plan of Care:  1. Patient was evaluated. X-Rays reviewed.  2. May return to work on 04/12/2019 with restrictions for four weeks.  3. Recommended good shoe gear.  4. Return to clinic in 4 weeks.     Edrick Kins, DPM [facility]  Dr. Edrick Kins, [address] [place]                                        [place], [place] [address]                Office [phone]  Fax [phone]

## 2019-05-03 ENCOUNTER — Ambulatory Visit (INDEPENDENT_AMBULATORY_CARE_PROVIDER_SITE_OTHER): Payer: BC Managed Care – PPO | Admitting: Podiatry

## 2019-05-03 ENCOUNTER — Other Ambulatory Visit: Payer: Self-pay

## 2019-05-03 ENCOUNTER — Encounter: Payer: Self-pay | Admitting: Podiatry

## 2019-05-03 ENCOUNTER — Ambulatory Visit (INDEPENDENT_AMBULATORY_CARE_PROVIDER_SITE_OTHER): Payer: BC Managed Care – PPO

## 2019-05-03 DIAGNOSIS — M7742 Metatarsalgia, left foot: Secondary | ICD-10-CM

## 2019-05-03 DIAGNOSIS — M79672 Pain in left foot: Secondary | ICD-10-CM

## 2019-05-08 NOTE — Progress Notes (Signed)
   Subjective:  Patient presents today status post Weil osteotomy 3rd metatarsal left. DOS: 03/04/2019. He states he is doing well. He reports a small nodule around the incision site that is mildly uncomfortable. He has been wearing good supportive shoes. He denies any other pain or concern. Patient is here for further evaluation and treatment.   No past medical history on file.    Objective/Physical Exam Neurovascular status intact.  Skin incisions appear to be well coapted. No sign of infectious process noted. No dehiscence. No active bleeding noted. Moderate edema noted to the surgical extremity.  Radiographic Exam:  Orthopedic hardware and osteotomies sites appear to be stable with routine healing.  Assessment: 1. s/p Weil osteotomy 3rd metatarsal left. DOS: 03/04/2019   Plan of Care:  1. Patient was evaluated. X-Rays reviewed.  2. Recommended good shoe gear.  3. May resume full activity with no restrictions.  4. Return to clinic as needed.    Edrick Kins, DPM [facility]  Dr. Edrick Kins, [address]                                        [place], [place] [address]                Office [phone]  Fax [phone]

## 2024-01-17 ENCOUNTER — Emergency Department (HOSPITAL_COMMUNITY)

## 2024-01-17 ENCOUNTER — Emergency Department (HOSPITAL_COMMUNITY)
Admission: EM | Admit: 2024-01-17 | Discharge: 2024-01-17 | Disposition: A | Discharge status: Home / Self Care | Attending: Emergency Medicine | Admitting: Emergency Medicine

## 2024-01-17 ENCOUNTER — Encounter (HOSPITAL_COMMUNITY): Payer: Self-pay

## 2024-01-17 ENCOUNTER — Other Ambulatory Visit: Payer: Self-pay

## 2024-01-17 DIAGNOSIS — X58XXXA Exposure to other specified factors, initial encounter: Secondary | ICD-10-CM | POA: Diagnosis not present

## 2024-01-17 DIAGNOSIS — R112 Nausea with vomiting, unspecified: Secondary | ICD-10-CM | POA: Diagnosis not present

## 2024-01-17 DIAGNOSIS — R519 Headache, unspecified: Secondary | ICD-10-CM | POA: Diagnosis not present

## 2024-01-17 DIAGNOSIS — S339XXA Sprain of unspecified parts of lumbar spine and pelvis, initial encounter: Secondary | ICD-10-CM | POA: Diagnosis not present

## 2024-01-17 DIAGNOSIS — R0981 Nasal congestion: Secondary | ICD-10-CM | POA: Insufficient documentation

## 2024-01-17 DIAGNOSIS — S335XXA Sprain of ligaments of lumbar spine, initial encounter: Secondary | ICD-10-CM

## 2024-01-17 DIAGNOSIS — M549 Dorsalgia, unspecified: Secondary | ICD-10-CM | POA: Diagnosis present

## 2024-01-17 LAB — RAPID URINE DRUG SCREEN, HOSP PERFORMED
Amphetamines: NOT DETECTED
Barbiturates: NOT DETECTED
Benzodiazepines: NOT DETECTED
Cocaine: POSITIVE — AB
Opiates: NOT DETECTED
Tetrahydrocannabinol: POSITIVE — AB

## 2024-01-17 LAB — CBC WITH DIFFERENTIAL/PLATELET
Abs Immature Granulocytes: 0.02 10*3/uL (ref 0.00–0.07)
Basophils Absolute: 0 10*3/uL (ref 0.0–0.1)
Basophils Relative: 0 %
Eosinophils Absolute: 0 10*3/uL (ref 0.0–0.5)
Eosinophils Relative: 1 %
HCT: 45.4 % (ref 39.0–52.0)
Hemoglobin: 15 g/dL (ref 13.0–17.0)
Immature Granulocytes: 0 %
Lymphocytes Relative: 14 %
Lymphs Abs: 1.1 10*3/uL (ref 0.7–4.0)
MCH: 28.4 pg (ref 26.0–34.0)
MCHC: 33 g/dL (ref 30.0–36.0)
MCV: 86 fL (ref 80.0–100.0)
Monocytes Absolute: 0.5 10*3/uL (ref 0.1–1.0)
Monocytes Relative: 7 %
Neutro Abs: 5.8 10*3/uL (ref 1.7–7.7)
Neutrophils Relative %: 78 %
Platelets: 288 10*3/uL (ref 150–400)
RBC: 5.28 MIL/uL (ref 4.22–5.81)
RDW: 12.8 % (ref 11.5–15.5)
WBC: 7.5 10*3/uL (ref 4.0–10.5)
nRBC: 0 % (ref 0.0–0.2)

## 2024-01-17 LAB — RESP PANEL BY RT-PCR (RSV, FLU A&B, COVID)  RVPGX2
Influenza A by PCR: NEGATIVE
Influenza B by PCR: NEGATIVE
Resp Syncytial Virus by PCR: NEGATIVE
SARS Coronavirus 2 by RT PCR: NEGATIVE

## 2024-01-17 LAB — BASIC METABOLIC PANEL WITH GFR
Anion gap: 10 (ref 5–15)
BUN: 9 mg/dL (ref 6–20)
CO2: 27 mmol/L (ref 22–32)
Calcium: 9.6 mg/dL (ref 8.9–10.3)
Chloride: 101 mmol/L (ref 98–111)
Creatinine, Ser: 0.9 mg/dL (ref 0.61–1.24)
GFR, Estimated: >60 mL/min (ref >60)
Glucose, Bld: 112 mg/dL — ABNORMAL HIGH (ref 70–99)
Potassium: 4.4 mmol/L (ref 3.5–5.1)
Sodium: 138 mmol/L (ref 135–145)

## 2024-01-17 MED ORDER — SODIUM CHLORIDE 0.9 % IV BOLUS
1000.0000 mL | Freq: Once | INTRAVENOUS | Status: AC
  Administered 2024-01-17: 1000 mL via INTRAVENOUS

## 2024-01-17 MED ORDER — ONDANSETRON HCL 4 MG/2ML IJ SOLN
4.0000 mg | Freq: Once | INTRAMUSCULAR | Status: AC
  Administered 2024-01-17: 4 mg via INTRAVENOUS
  Filled 2024-01-17: qty 2

## 2024-01-17 MED ORDER — METHOCARBAMOL 500 MG PO TABS: 500.0000 mg | ORAL_TABLET | Freq: Two times a day (BID) | ORAL | 0 refills | Status: AC

## 2024-01-17 MED ORDER — PREDNISONE 20 MG PO TABS: 40.0000 mg | ORAL_TABLET | Freq: Every day | ORAL | 0 refills | Status: AC

## 2024-01-17 MED ORDER — KETOROLAC TROMETHAMINE 15 MG/ML IJ SOLN
15.0000 mg | Freq: Once | INTRAMUSCULAR | Status: AC
  Administered 2024-01-17: 15 mg via INTRAVENOUS
  Filled 2024-01-17: qty 1

## 2024-01-17 MED ORDER — ONDANSETRON 4 MG PO TBDP: 4.0000 mg | ORAL_TABLET | Freq: Three times a day (TID) | ORAL | 0 refills | Status: AC | PRN

## 2024-01-17 MED ORDER — PROCHLORPERAZINE EDISYLATE 10 MG/2ML IJ SOLN
10.0000 mg | Freq: Once | INTRAMUSCULAR | Status: AC
  Administered 2024-01-17: 10 mg via INTRAVENOUS
  Filled 2024-01-17: qty 2

## 2024-01-17 MED ORDER — ACETAMINOPHEN 325 MG PO TABS
975.0000 mg | ORAL_TABLET | Freq: Once | ORAL | Status: AC
  Administered 2024-01-17: 975 mg via ORAL
  Filled 2024-01-17: qty 3

## 2024-01-17 MED ORDER — NAPROXEN 500 MG PO TABS: 500.0000 mg | ORAL_TABLET | Freq: Two times a day (BID) | ORAL | 0 refills | Status: AC

## 2024-01-17 MED ORDER — DIPHENHYDRAMINE HCL 50 MG/ML IJ SOLN
12.5000 mg | Freq: Once | INTRAMUSCULAR | Status: AC
  Administered 2024-01-17: 12.5 mg via INTRAVENOUS
  Filled 2024-01-17: qty 1

## 2024-01-17 NOTE — Discharge Instructions (Addendum)
 Please find primary care for follow-up.  Take steroids for the next 5 days.  You can take naproxen after you are done with steroids.  You can take Robaxin as needed for muscle relaxer.  Use ice and heat over area of pain.  I have sent Zofran for nausea and vomiting.  Return to emergency room with new or worsening symptoms.

## 2024-01-17 NOTE — ED Notes (Signed)
 Patient transported to CT

## 2024-01-17 NOTE — ED Provider Triage Note (Signed)
 Emergency Medicine Provider Triage Evaluation Note  Howard Wolf , a 48 y.o. male  was evaluated in triage.  Pt complains of dizziness, HA.  HA described as pressure/pain 10/10. HA came on at maximum intensity and has stayed that way. Only time it resolves it when sleeping. Tried ibuprofen wo much relief. No recent falls, thinners  Currently no dizziness nor blurred vision. Associated photophobia. No hx of migraines. No weakness nor paresthesia of BLE, BUE  Actively vomiting in triage. Vomited couple of times this week.  Lower back pain for past week. No known precipitating event, IVDU, malignancy, saddle paresthesia, urinary symptoms  Smokes marijuana daily for last couple years but has not had it in four days. Couple shots of liquor a couple days ago. No hx of withdrawal seizures or DT  Review of Systems  Positive: See hpi Negative: fevers  Physical Exam  BP (!) 123/102   Pulse 80   Temp 99 F (37.2 C)   Resp 18   Ht 5\' 9"  (1.753 m)   Wt 59 kg   SpO2 92%   BMI 19.20 kg/m  Gen:   Awake, no distress  Resp:  Normal effort MSK:   Moves extremities without difficulty . TTP of lumbar spine Other:  Grip strength normal, no nystagmus, no blurred vision, sensation of BUE and BLE equal and intact. A&Ox3. Lumbar spine TTP  Medical Decision Making  Medically screening exam initiated at 11:13 AM.  Appropriate orders placed.  Howard Wolf was informed that the remainder of the evaluation will be completed by another provider, this initial triage assessment does not replace that evaluation, and the importance of remaining in the ED until their evaluation is complete.  Labs, imaging ordered. Started IV and provided zofran, tylenol    Royann Cords, [place] 01/17/24 1126

## 2024-01-17 NOTE — ED Triage Notes (Signed)
 Pt arrived via POV c/o head and nasal congestion x 1 week. Pt denies any other symptoms at present.

## 2024-01-17 NOTE — ED Provider Notes (Signed)
 [place] EMERGENCY DEPARTMENT AT [facility] Provider Note   CSN: [id number] Arrival date & time: 01/17/24  0453     History  Chief Complaint  Patient presents with   Nasal Congestion   Headache   Emesis    Howard Wolf is a 48 y.o. male.  Patient with noncontributory past medical history reports to emergency room with complaint of headache.  Patient notes that he has had nasal congestion, headache and nausea vomiting for the past week.  He describes his headache as tension in nature wrap around his entire head.  He reports headache is gone when he wakes up in the morning but gradually worsens throughout the day.  Currently rates his headache 10 out of 10. Has had some episodes of vomiting. No dizziness. He denies any vision changes, slurred speech or numbness or tingling.  Denies any injury trauma or fall. Patient also notes he has had several weeks of back pain, close is back pain over lumbar spine.  No injury trauma or fall. On his feet throughout the day, worsening pain. No saddle anesthesia.  No loss of bowel or bladder.  No history of IV drug use or fever.  No history of leg cancer.   Headache Associated symptoms: vomiting   Emesis Associated symptoms: headaches        Home Medications Prior to Admission medications   Medication Sig Start Date End Date Taking? Authorizing Provider  famotidine  (PEPCID ) 20 MG tablet Take 1 tablet (20 mg total) by mouth 2 (two) times daily. 11/24/13   Markus Sill, PA-C  NONFORMULARY OR COMPOUNDED ITEM Apply 1-2 g topically 4 (four) times daily - after meals and at bedtime. 07/15/17   Dot Gazella, DPM  oxyCODONE -acetaminophen  (PERCOCET) 5-325 MG tablet Take 1 tablet by mouth every 6 (six) hours as needed for severe pain. 03/05/19   Dot Gazella, DPM      Allergies    Bee venom    Review of Systems   Review of Systems  Gastrointestinal:  Positive for vomiting.  Neurological:  Positive for headaches.    Physical  Exam Updated Vital Signs BP (!) 123/102   Pulse 80   Temp 99 F (37.2 C)   Resp 18   Ht 5\' 9"  (1.753 m)   Wt 59 kg   SpO2 92%   BMI 19.20 kg/m  Physical Exam Vitals and nursing note reviewed.  Constitutional:      General: He is not in acute distress.    Appearance: He is not toxic-appearing.  HENT:     Head: Normocephalic and atraumatic.  Eyes:     General: No scleral icterus.    Conjunctiva/sclera: Conjunctivae normal.  Neck:     Comments: No meningeal signs. Cardiovascular:     Rate and Rhythm: Normal rate and regular rhythm.     Pulses: Normal pulses.     Heart sounds: Normal heart sounds.  Pulmonary:     Effort: Pulmonary effort is normal. No respiratory distress.     Breath sounds: Normal breath sounds.  Abdominal:     General: Abdomen is flat. Bowel sounds are normal.     Palpations: Abdomen is soft.     Tenderness: There is no abdominal tenderness.  Musculoskeletal:     Cervical back: No rigidity or tenderness.     Comments: Reproducible tenderness on exam over left paravertebral musculature.  Skin:    General: Skin is warm and dry.     Findings: No lesion.  Neurological:  General: No focal deficit present.     Mental Status: He is alert and oriented to person, place, and time. Mental status is at baseline.     Comments: And oriented answering questions appropriately with no slurred speech.  Pupils equal and reactive to light.  No nystagmus.  No ataxia finger-to-nose.  Ambulates with steady gait.  Upper extremity sensation and strength intact. Lower extremity sensation and strength intact.     ED Results / Procedures / Treatments   Labs (all labs ordered are listed, but only abnormal results are displayed) Labs Reviewed  BASIC METABOLIC PANEL WITH GFR - Abnormal; Notable for the following components:      Result Value   Glucose, Bld 112 (*)    All other components within normal limits  RAPID URINE DRUG SCREEN, HOSP PERFORMED - Abnormal; Notable for  the following components:   Cocaine POSITIVE (*)    Tetrahydrocannabinol POSITIVE (*)    All other components within normal limits  RESP PANEL BY RT-PCR (RSV, FLU A&B, COVID)  RVPGX2  CBC WITH DIFFERENTIAL/PLATELET    EKG None  Radiology DG Lumbar Spine Complete Result Date: 01/17/2024 CLINICAL DATA:  Back pain. EXAM: LUMBAR SPINE - COMPLETE 4+ VIEW COMPARISON:  None Available. FINDINGS: There are five non-rib bearing lumbar-type vertebral bodies. There is normal alignment. There is no evidence for acute fracture or subluxation. Intervertebral disc spaces are preserved without significant degenerative changes. Visualized abdomen is unremarkable. IMPRESSION: No acute fracture or subluxation. Electronically Signed   By: Clancy Crimes M.D.   On: 01/17/2024 13:06   CT Head Wo Contrast Result Date: 01/17/2024 CLINICAL DATA:  Headache, sudden, severe EXAM: CT HEAD WITHOUT CONTRAST TECHNIQUE: Contiguous axial images were obtained from the base of the skull through the vertex without intravenous contrast. RADIATION DOSE REDUCTION: This exam was performed according to the departmental dose-optimization program which includes automated exposure control, adjustment of the mA and/or kV according to patient size and/or use of iterative reconstruction technique. COMPARISON:  None Available. FINDINGS: Brain: No evidence of large-territorial acute infarction. No parenchymal hemorrhage. No mass lesion. No extra-axial collection. No mass effect or midline shift. No hydrocephalus. Basilar cisterns are patent. Vascular: No hyperdense vessel. Skull: No acute fracture or focal lesion. Sinuses/Orbits: Paranasal sinuses and mastoid air cells are clear. The orbits are unremarkable. Other: None. IMPRESSION: No acute intracranial abnormality. Electronically Signed   By: Morgane  Naveau M.D.   On: 01/17/2024 12:50    Procedures Procedures    Medications Ordered in ED Medications  ondansetron (ZOFRAN) injection 4 mg  (4 mg Intravenous Given 01/17/24 1140)  acetaminophen  (TYLENOL ) tablet 975 mg (975 mg Oral Given 01/17/24 1139)  prochlorperazine (COMPAZINE) injection 10 mg (10 mg Intravenous Given 01/17/24 1406)  ketorolac (TORADOL) 15 MG/ML injection 15 mg (15 mg Intravenous Given 01/17/24 1406)  diphenhydrAMINE (BENADRYL) injection 12.5 mg (12.5 mg Intravenous Given 01/17/24 1405)  sodium chloride 0.9 % bolus 1,000 mL (1,000 mLs Intravenous New Bag/Given 01/17/24 1406)    ED Course/ Medical Decision Making/ A&P                                 Medical Decision Making Risk Prescription drug management.   Howard Wolf 48 y.o. presented today for HA. Working Ddx: tension headache, migraine, intracranial mass, intracranial hemorrhage, intracranial infection including meningitis vs encephalitis, trigeminal neuralgia, AVM, sinusitis, cerebral aneurysm, muscular headache, cavernous sinus thrombosis, carotid artery dissection.  R/o DDx:  intracranial mass, hemorrhage,or infection including meningitis vs encephalitis, trigeminal neuralgia, AVM, cerebral aneurysm, muscular headache, cavernous sinus thrombosis, carotid artery dissection are less likely due to history of present illness, physical exam, labs/imaging findings  PMHX: Noncontributory  Unique Tests and My Interpretation:     Problem List / ED Course / Critical interventions / Medication management  Patient presenting to emergency room with complaint of headache associated with nasal congestion.  He is hemodynamically stable and well-appearing.  Patient has had severe headache but denies sudden onset or severe.  He reports that this starts and gradually worsened throughout the day.  He has no focal neurological deficits.  He does not have similar symptoms of headache but he has had congestion which he feels is most likely contributing to symptoms.  Head CT without any acute findings.  Patient also mentions that he has had some low back pain.  On my exam he has  no tenderness to palpation over lumbar spine.  He has no radicular symptoms.  No red flag symptoms associated with his back pain.  This is likely related to overuse from his job.  He has no acute fracture on imaging.  Reassuring labs and imaging.  Feel he is appropriate for outpatient follow-up.  We discussed symptom management. I ordered medication including migraine cocktail Reevaluation of the patient after these medicines showed that the patient improved Patients vitals assessed. Upon arrival patient is hemodynamically stable.  I have reviewed the patients home medicines and have made adjustments as needed   Plan: F/u w/ PCP in 2-3d to ensure resolution of sx.  Patient was given return precautions. Patient stable for discharge at this time.  Patient educated on sx/dx and verbalized understanding of plan. Return to ED if new or worsening sx.           Final Clinical Impression(s) / ED Diagnoses Final diagnoses:  Bad headache  Lumbar sprain, initial encounter    Rx / DC Orders ED Discharge Orders          Ordered    predniSONE (DELTASONE) 20 MG tablet  Daily        01/17/24 1501    methocarbamol (ROBAXIN) 500 MG tablet  2 times daily        01/17/24 1501    naproxen (NAPROSYN) 500 MG tablet  2 times daily        01/17/24 1501    ondansetron (ZOFRAN-ODT) 4 MG disintegrating tablet  Every 8 hours PRN        01/17/24 1508              Rie Mcneil, Kandace Organ, PA-C 01/17/24 1520    Trish Furl, MD 01/18/24 [phone]
